# Patient Record
Sex: Male | Born: 1960 | Race: White | Hispanic: No | Smoking: Former smoker
Health system: Southern US, Community
[De-identification: ages and names within clinical notes are randomized; demographics above are authoritative.]

## PROBLEM LIST (undated history)

## (undated) DIAGNOSIS — C20 Malignant neoplasm of rectum: Secondary | ICD-10-CM

## (undated) DIAGNOSIS — I10 Essential (primary) hypertension: Secondary | ICD-10-CM

## (undated) HISTORY — DX: Essential (primary) hypertension: I10

---

## 2012-08-02 ENCOUNTER — Ambulatory Visit: Payer: Self-pay | Admitting: Family Medicine

## 2012-08-18 ENCOUNTER — Ambulatory Visit: Payer: Self-pay | Admitting: Family Medicine

## 2013-04-11 ENCOUNTER — Emergency Department: Payer: Self-pay | Admitting: Internal Medicine

## 2013-04-11 LAB — COMPREHENSIVE METABOLIC PANEL
ALK PHOS: 85 U/L
ALT: 94 U/L — AB (ref 12–78)
AST: 31 U/L (ref 15–37)
Albumin: 4.6 g/dL (ref 3.4–5.0)
Anion Gap: 3 — ABNORMAL LOW (ref 7–16)
BUN: 12 mg/dL (ref 7–18)
Bilirubin,Total: 0.4 mg/dL (ref 0.2–1.0)
CALCIUM: 9.8 mg/dL (ref 8.5–10.1)
Chloride: 102 mmol/L (ref 98–107)
Co2: 31 mmol/L (ref 21–32)
Creatinine: 0.93 mg/dL (ref 0.60–1.30)
EGFR (African American): >60
Glucose: 99 mg/dL (ref 65–99)
Osmolality: 272 (ref 275–301)
Potassium: 3.6 mmol/L (ref 3.5–5.1)
Sodium: 136 mmol/L (ref 136–145)
Total Protein: 7.7 g/dL (ref 6.4–8.2)

## 2013-04-11 LAB — URINALYSIS, COMPLETE
BACTERIA: NONE SEEN
BLOOD: NEGATIVE
Bilirubin,UR: NEGATIVE
Glucose,UR: NEGATIVE mg/dL (ref 0–75)
Ketone: NEGATIVE
LEUKOCYTE ESTERASE: NEGATIVE
NITRITE: NEGATIVE
PH: 5 (ref 4.5–8.0)
Protein: NEGATIVE
Specific Gravity: 1.01 (ref 1.003–1.030)
WBC UR: NONE SEEN /HPF (ref 0–5)

## 2013-04-11 LAB — TROPONIN I: Troponin-I: <0.02 ng/mL

## 2013-04-11 LAB — CBC
HCT: 49.7 % (ref 40.0–52.0)
HGB: 17.4 g/dL (ref 13.0–18.0)
MCH: 32.1 pg (ref 26.0–34.0)
MCHC: 35.1 g/dL (ref 32.0–36.0)
MCV: 91 fL (ref 80–100)
Platelet: 197 10*3/uL (ref 150–440)
RBC: 5.43 10*6/uL (ref 4.40–5.90)
RDW: 13.4 % (ref 11.5–14.5)
WBC: 8.8 10*3/uL (ref 3.8–10.6)

## 2013-06-21 IMAGING — CT CT NECK WITH CONTRAST
2 series · 10 of 14 positions shown, 12 images · IV contrast (agent unspecified)
Comparison: None

REASON FOR EXAM: neck mass
COMMENTS:

PROCEDURE:     KCT - KCT NECK WITH CONTRAST  - August 18, 2012  [DATE]
RESULT:     Indication: Neck mass
TECHNIQUE: Multiple sequential axial images from the apices of the lungs to
the level of the orbits obtained with 100 ml [id number] IV contrast.

[Series 2: neck 3.0 3 · axial · 0.52mm/px · z∈[-432,-166]mm · 8 of 115 slices shown, 10 images]
[im 13/115  soft-tissue]
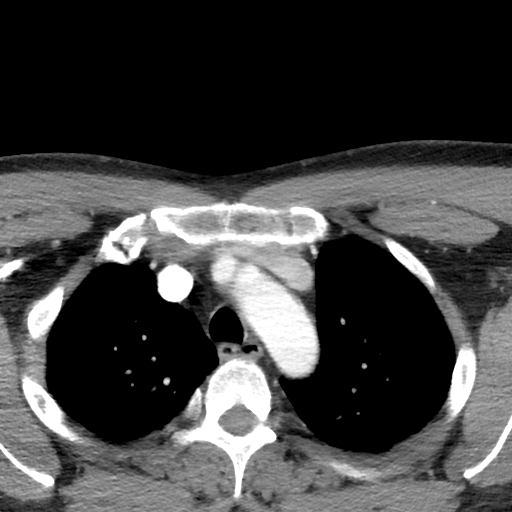
[im 13/115  bone]
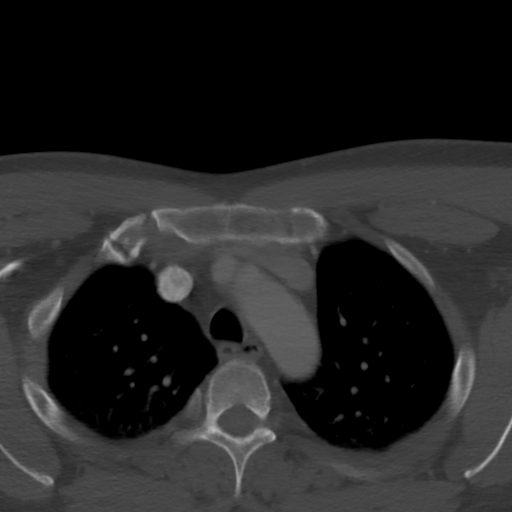
[im 26/115  bone]
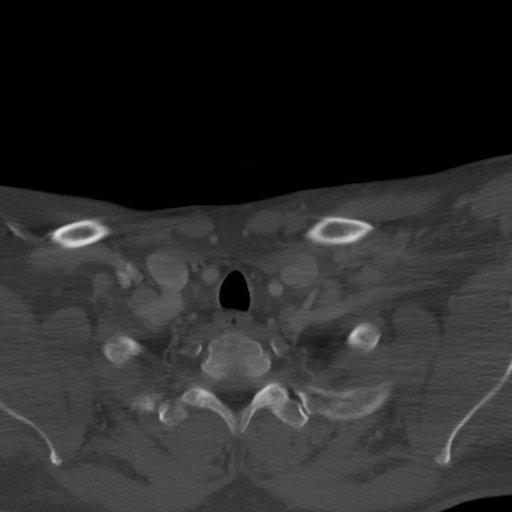
[im 39/115  bone]
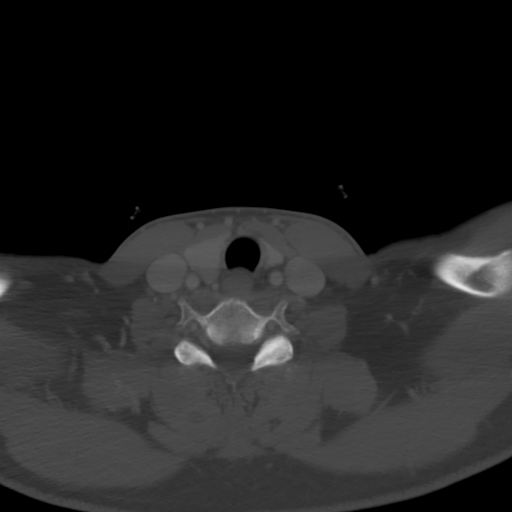
[im 51/115  bone]
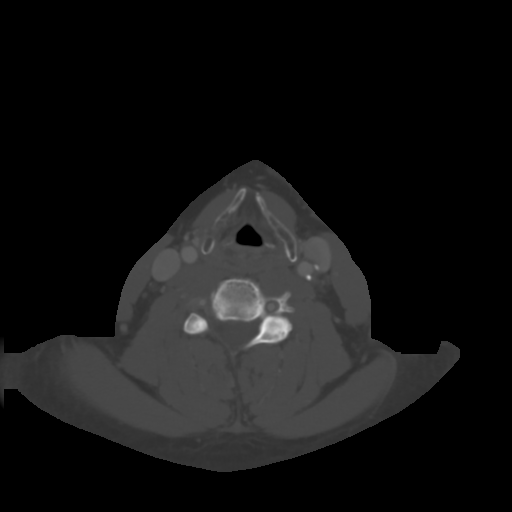
[im 64/115  soft-tissue]
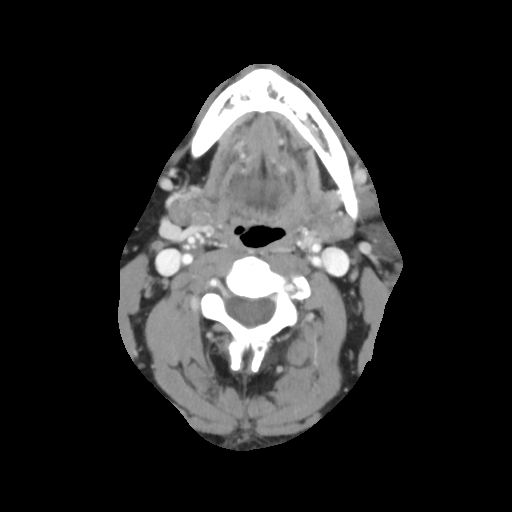
[im 64/115  bone]
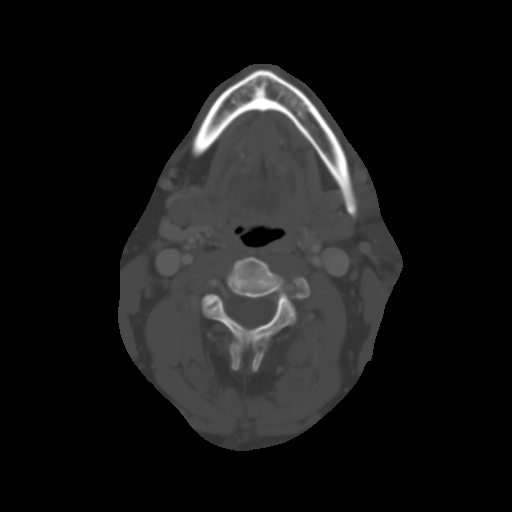
[im 77/115  bone]
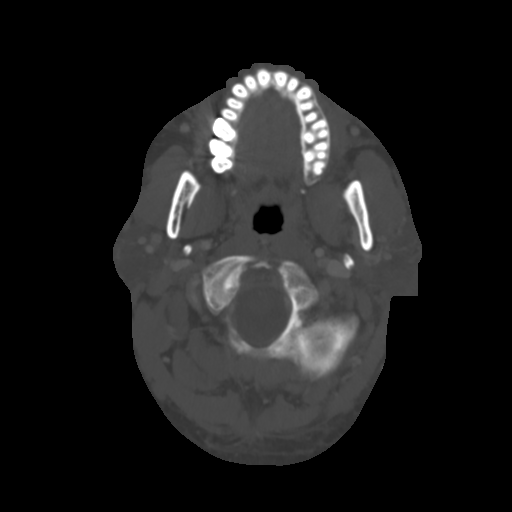
[im 89/115  bone]
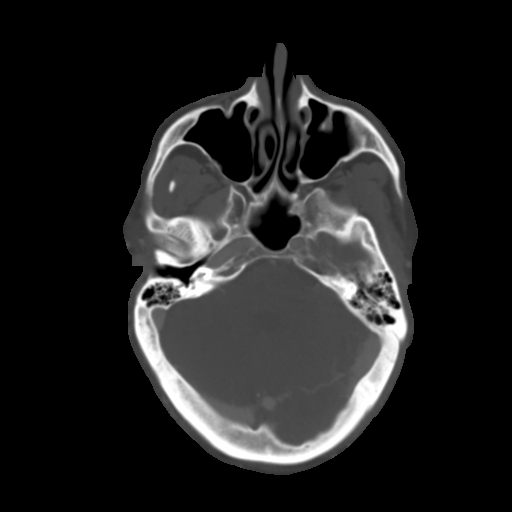
[im 102/115  bone]
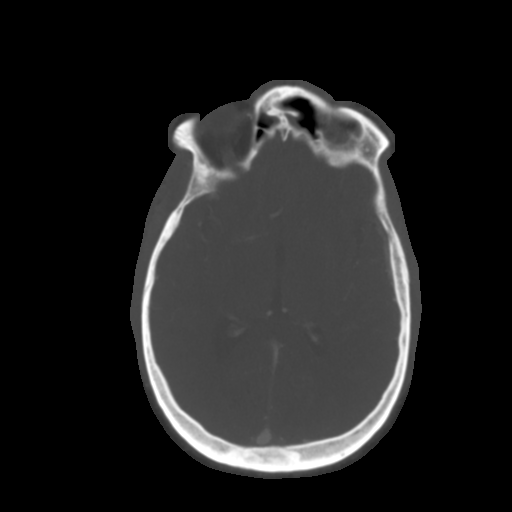

[Series 5: lung · axial · 0.62mm/px · z∈[-430,-390]mm · 2 of 41 slices shown]
[im 14/41  bone]
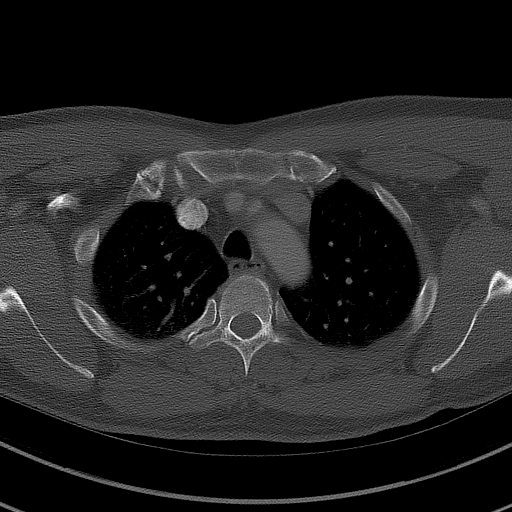
[im 27/41  bone]
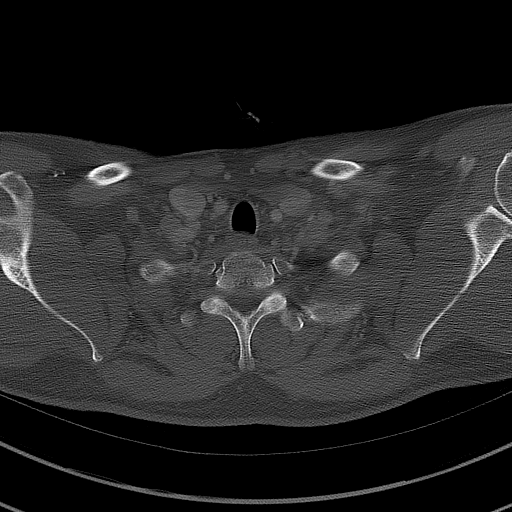

[10 of 14 positions shown; findings below may reference images not displayed]

FINDINGS: There is no lymphadenopathy. There is no nasopharyngeal or
oropharyngeal mass. There is no focal fluid collection. The airway is
patent. The visualized portions of the carotid arteries and internal jugular
veins are patent.

There is a 1.3 x 1.2 cm fat attenuating mass in the right anterior neck at
the site of clinical palpable abnormality. The appearance is most consistent
with a lipoma.

The visualized portions of the brain demonstrate no focal abnormality.

There is minimal mucosal thickening in the ethmoid sinuses and
frontoethmoidal recesses. There is mild mucosal thickening in the right
maxillary sinus. There is a small mucus retention cyst in bilateral
maxillary sinuses.

There is no lytic or blastic osseous lesion.

The lung apices are clear.
IMPRESSION: 1. Small lipoma at the site of clinical palpable abnormality in the right
anterior neck.

[REDACTED]

## 2014-02-12 IMAGING — CT CT HEAD WITHOUT CONTRAST
1 series · 15 of 30 positions shown, 19 images · non-contrast
Comparison: CT NECK W/ CM dated 08/18/2012

CLINICAL DATA: One week history of headaches, history of
hypertension

EXAM:
CT HEAD WITHOUT CONTRAST
TECHNIQUE: Contiguous axial images were obtained from the base of the skull
through the vertex without intravenous contrast.

[Series 2: head wo · axial · 0.46mm/px · z∈[-131,+4]mm · 15 of 34 slices shown, 19 images]
[im 2/34  brain]
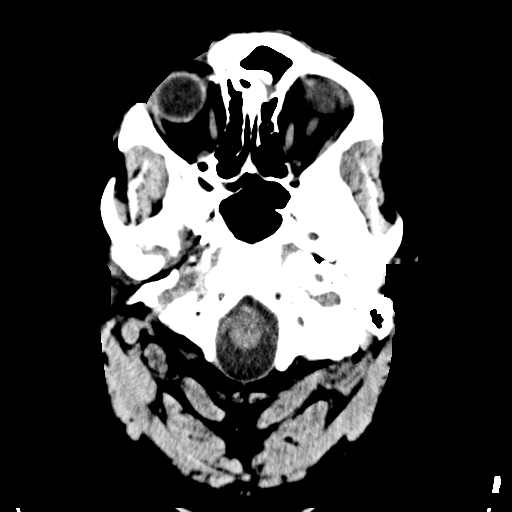
[im 2/34  bone]
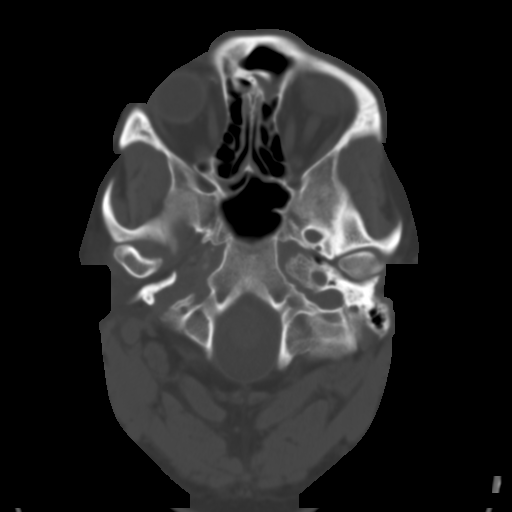
[im 4/34  brain]
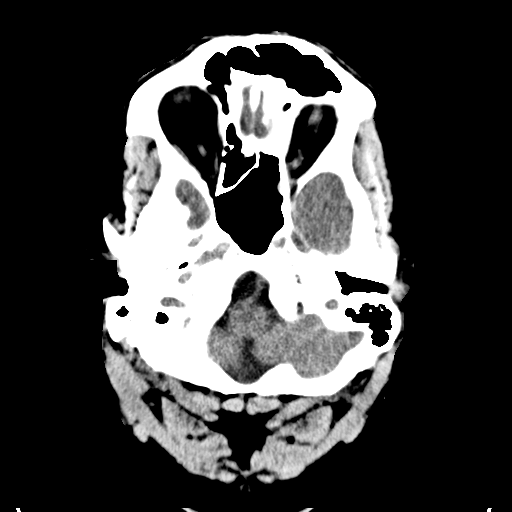
[im 6/34  brain]
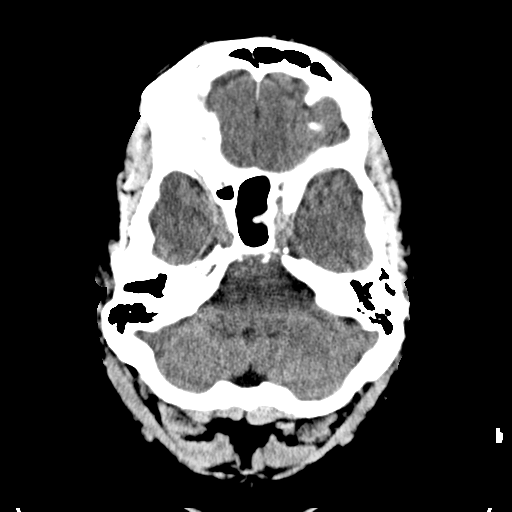
[im 8/34  brain]
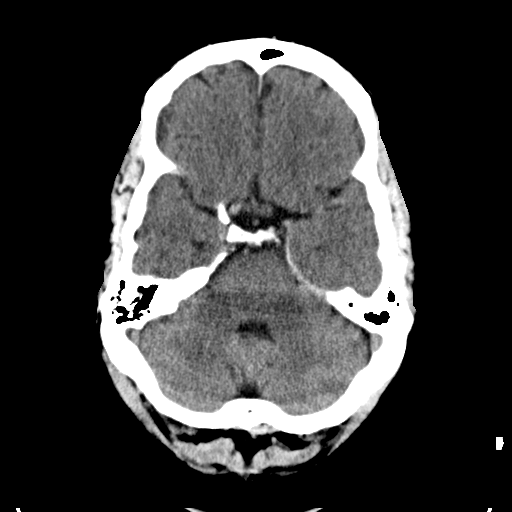
[im 11/34  brain]
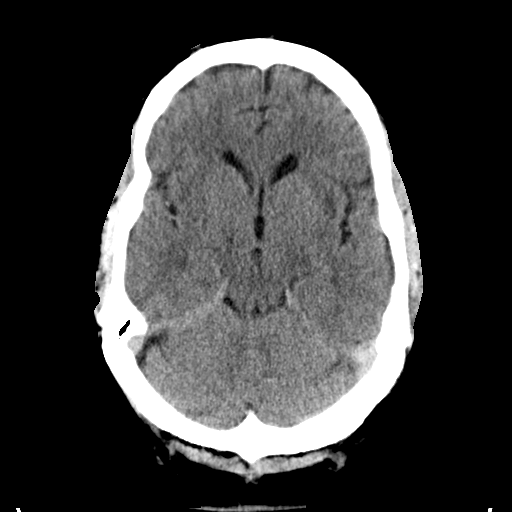
[im 11/34  bone]
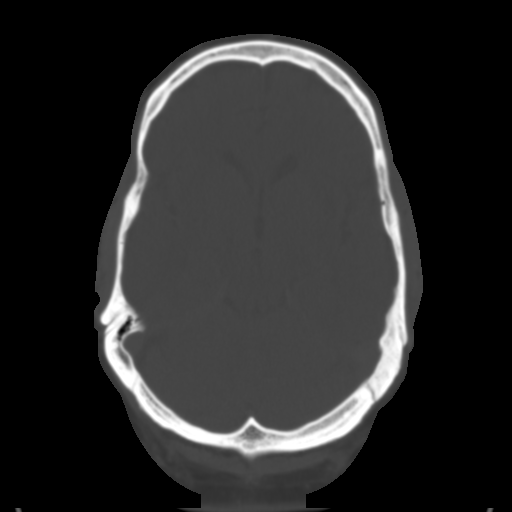
[im 13/34  brain]
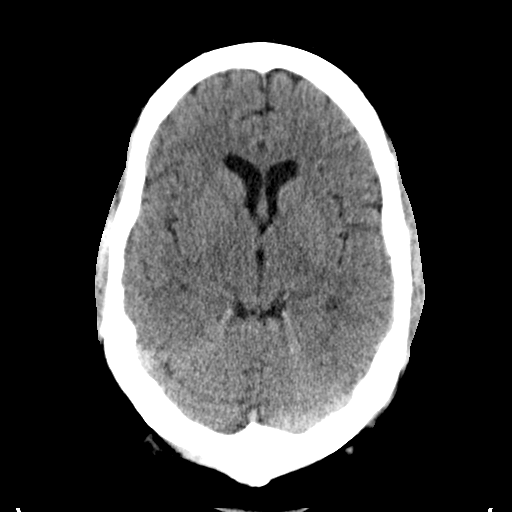
[im 15/34  brain]
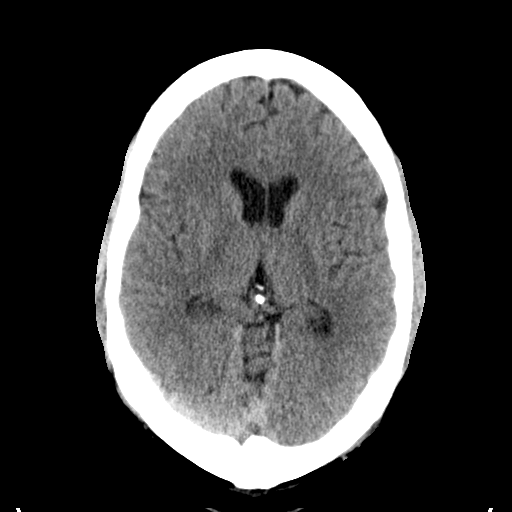
[im 18/34  brain]
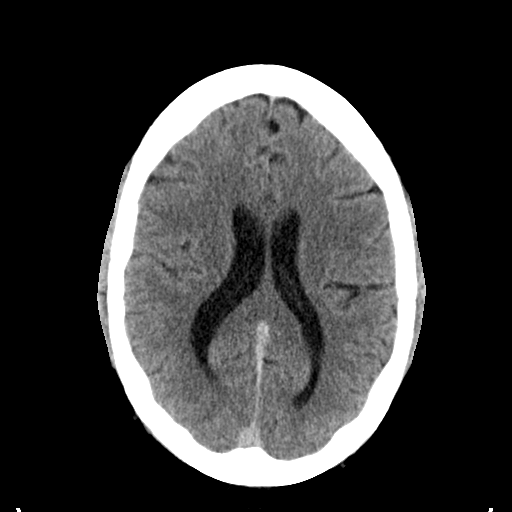
[im 19/34  brain]
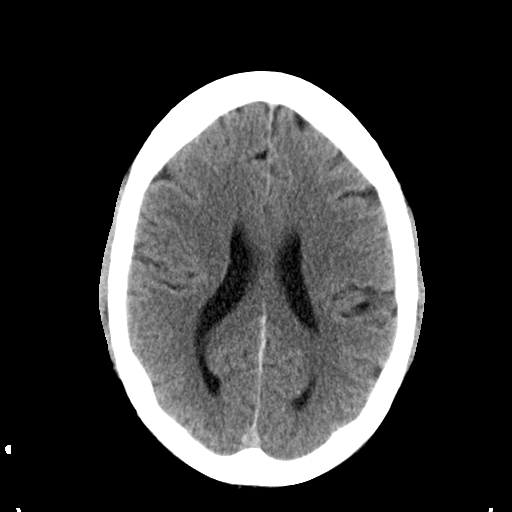
[im 19/34  bone]
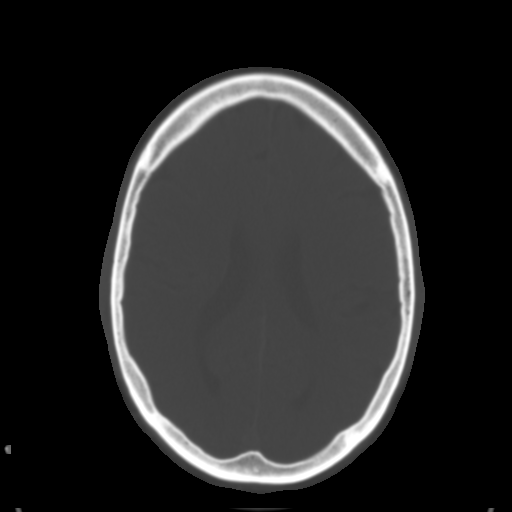
[im 21/34  brain]
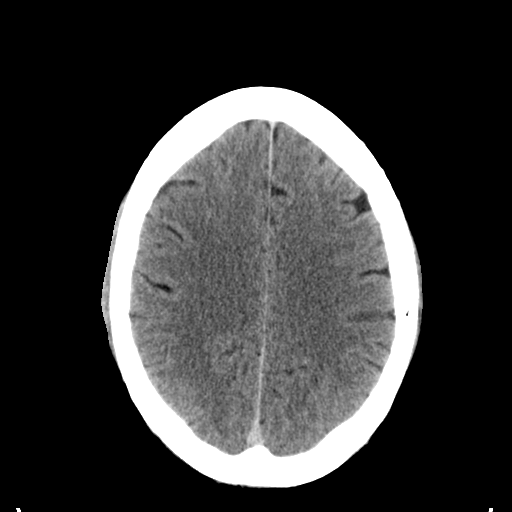
[im 23/34  brain]
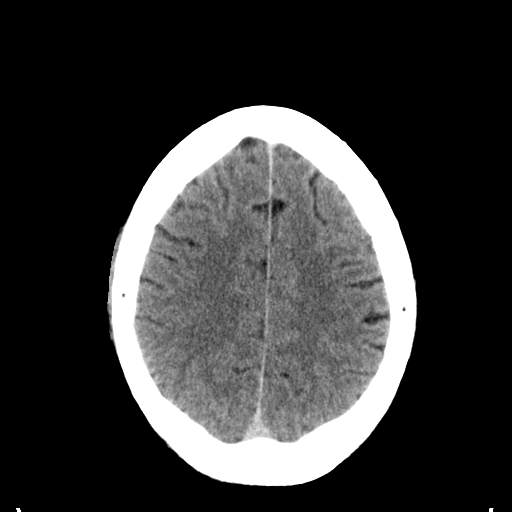
[im 26/34  brain]
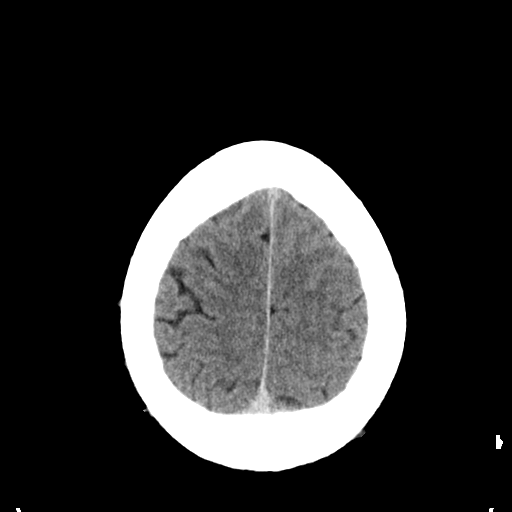
[im 28/34  brain]
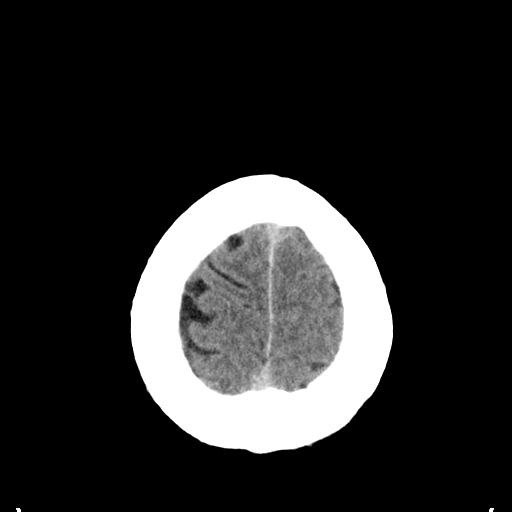
[im 28/34  bone]
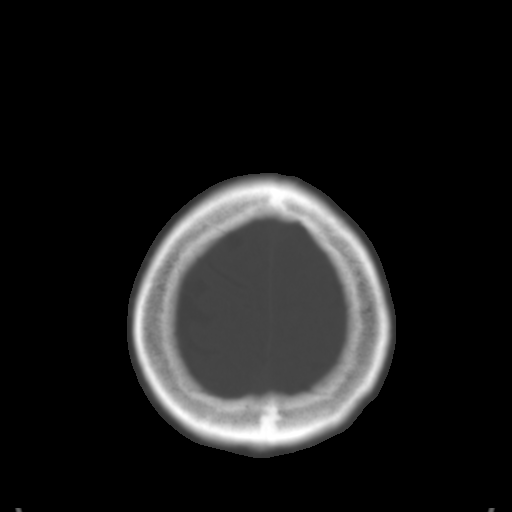
[im 30/34  brain]
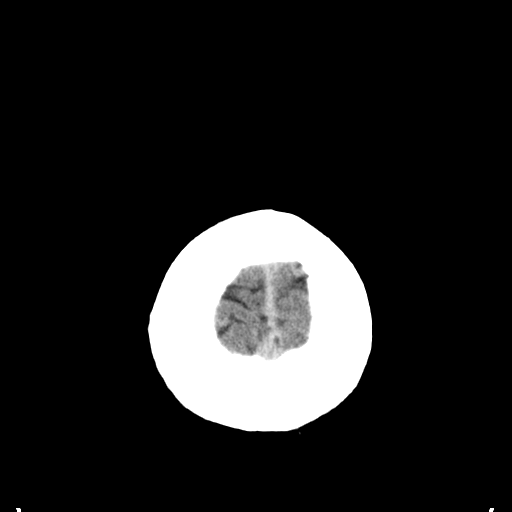
[im 32/34  brain]
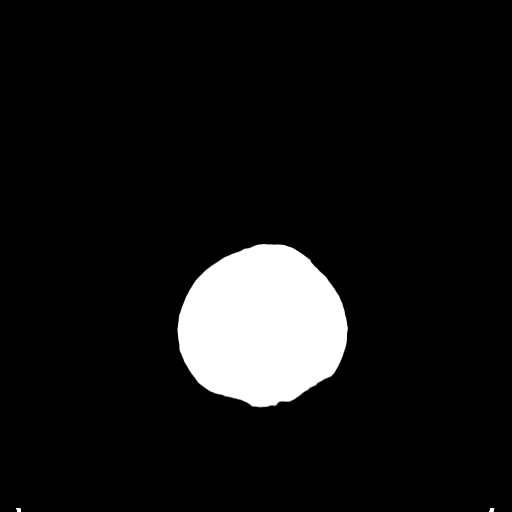

[15 of 30 positions shown; findings below may reference images not displayed]

FINDINGS: The ventricles are normal in size and position. There is no
intracranial hemorrhage nor intracranial mass effect. There is no
evidence of an evolving ischemic infarction. There is subtle
decreased density in the periphery of the basal ganglia on the left
which may reflect the sequelae of previous lacunar infarction. This
is similar to that seen on the uppermost images of a neck CT scan August 18, 2012. There are no abnormal intracranial calcifications. The
cerebellum and brainstem are normal in appearance.

At bone window settings there is mild mucoperiosteal thickening
within several ethmoid sinus cells as well as in the sphenoid sinus.
The maxillary sinuses are not included in the field of view. The
frontal sinuses are clear. No air-fluid levels are demonstrated. The
mastoid air cells are well pneumatized. There is no lytic or blastic
bony lesion of the calvarium nor evidence of an acute fracture.
IMPRESSION: 1. There is no acute intracranial abnormality.
2. There is chronically decreased density in the periphery of the
basal ganglia on the left which may reflect the sequelae of chronic
small vessel ischemic type change.
3. Mucoperiosteal thickening within the ethmoid and sphenoid sinus
cells may reflect sinusitis.

## 2014-06-24 ENCOUNTER — Ambulatory Visit (INDEPENDENT_AMBULATORY_CARE_PROVIDER_SITE_OTHER): Payer: Self-pay | Admitting: General Surgery

## 2014-06-24 ENCOUNTER — Encounter: Payer: Self-pay | Admitting: General Surgery

## 2014-06-24 VITALS — BP 140/78 | HR 78 | Resp 12 | Ht 69.0 in | Wt 203.0 lb

## 2014-06-24 DIAGNOSIS — K6289 Other specified diseases of anus and rectum: Secondary | ICD-10-CM

## 2014-06-24 NOTE — Progress Notes (Signed)
Patient ID: Bobby Rice, male   DOB: [birth year], 54 y.o.   MRN: [id number]  Chief Complaint  Patient presents with  . Other    hemorrhoids    HPI Bobby Rice is a 54 y.o. male here today for a evaluation of hemorrhoids. Patient states he noticed them 16 months ago. He states he has try all kinds of medication (vitamin E, Anusol, Prepaatin H, suposiories) and he reports none of them  Has provide consistet relef. He states they come and go. When they flare up he states there is blood on the paper and in the bowel.  The last episode of bleeding was 6 months ago.   the patient was evaluated through the walk-in Center in   May-June 2014 Re: testicular and ointment pain.  HPI  Past Medical History  Diagnosis Date  . Hypertension     No past surgical history on file.  No family history on file.  Social History History  Substance Use Topics  . Smoking status: Current Every Day Smoker -- 1.00 packs/day for 5 years    Types: Cigars, Cigarettes  . Smokeless tobacco: Not on file  . Alcohol Use: 1.2 oz/week    2 Standard drinks or equivalent per week    Allergies  Allergen Reactions  . Codeine     Current Outpatient Prescriptions  Medication Sig Dispense Refill  . Multiple Vitamin (MULTI-VITAMINS) TABS Take by mouth.    . valsartan (DIOVAN) 40 MG tablet Take 40 mg by mouth daily.     No current facility-administered medications for this visit.    Review of Systems Review of Systems  Constitutional: Negative.   Respiratory: Negative.   Cardiovascular: Negative.   Gastrointestinal: Negative.     Blood pressure 140/78, pulse 78, resp. rate 12, height 5\' 9"  (1.753 m), weight 203 lb (92.08 kg).  Physical Exam Physical Exam  Constitutional: He is oriented to person, place, and time. He appears well-developed and well-nourished.  Eyes: Conjunctivae are normal. No scleral icterus.  Cardiovascular: Normal rate, regular rhythm and normal heart sounds.   Pulmonary/Chest:  Effort normal and breath sounds normal.  Abdominal: Soft. Normal appearance and bowel sounds are normal. There is no hepatomegaly. There is no tenderness.  Genitourinary:        Unable to complete digital rectal exam secondary to discomfort, even after 3 cc 2% Xylocaine jelly instilled into the anal canal.   Neurological: He is alert and oriented to person, place, and time.  Skin: Skin is warm and dry.    Data Reviewed  Testicular ultrasound of May 2014 and CT of June 2014 reviewed. Latter study failed to confirm 5 cm mass in left inguinal area. Hydroceles noted.   Assessment    Rectal pain, intermittent bleeding.    Plan    It was not possible to complete a digital exam today.  Options: 1) observation; 2) colonoscopy (of age for screening and to allow evaluation of anal canal vs 3) EUA. Pros and cons of each reviewed. Patient is reluctant to consider colonoscopy as pain in the anal area is more pronounced with each additional stool (daily).  To consider his options and notify the office of how he would like to proceed.        [name] 06/24/2014, 9:24 PM

## 2014-09-16 ENCOUNTER — Ambulatory Visit
Admission: EM | Admit: 2014-09-16 | Discharge: 2014-09-16 | Disposition: A | Discharge status: Home / Self Care | Payer: Self-pay | Attending: Internal Medicine | Admitting: Internal Medicine

## 2014-09-16 ENCOUNTER — Encounter: Payer: Self-pay | Admitting: *Deleted

## 2014-09-16 DIAGNOSIS — K645 Perianal venous thrombosis: Secondary | ICD-10-CM

## 2014-09-16 MED ORDER — HYDROCORTISONE ACE-PRAMOXINE 1-1 % RE FOAM: 1.0000 | Freq: Two times a day (BID) | RECTAL | Status: DC

## 2014-09-16 MED ORDER — DOCUSATE SODIUM 100 MG PO CAPS: 100.0000 mg | ORAL_CAPSULE | Freq: Two times a day (BID) | ORAL | Status: AC

## 2014-09-16 NOTE — ED Provider Notes (Signed)
CSN: [id number]     Arrival date & time 09/16/14  0174 History   First MD Initiated Contact with Patient 09/16/14 0912     Chief Complaint  Patient presents with  . Hemorrhoids   (Consider location/radiation/quality/duration/timing/severity/associated sxs/prior Treatment) HPI        54 year old male presents for evaluation of a painful hemorrhoid. He has had this for over 2 years, getting gradually worse. He is seen his primary care provider and has been prescribed numerous prescription creams and ointments. He has executed lifestyle changes also with no relief. He has seen a general surgeon who recommended rectal exam under anesthesia and possible hemorrhoidectomy over a month ago, he did want to do this. In the last month it has gotten worse. He thinks he may have a thrombosed hemorrhoid. It is very painful for the last 2 days, he describes it as feeling like a toothache. The hemorrhoid never prolapses. No bleeding.  No systemic sxs.    Past Medical History  Diagnosis Date  . Hypertension    History reviewed. No pertinent past surgical history. No family history on file. History  Substance Use Topics  . Smoking status: Current Every Day Smoker -- 1.00 packs/day for 5 years    Types: Cigars, Cigarettes  . Smokeless tobacco: Not on file  . Alcohol Use: 1.2 oz/week    2 Standard drinks or equivalent per week    Review of Systems  Gastrointestinal: Positive for rectal pain.  All other systems reviewed and are negative.   Allergies  Codeine  Home Medications   Prior to Admission medications   Medication Sig Start Date End Date Taking? Authorizing Provider  Multiple Vitamin (MULTI-VITAMINS) TABS Take by mouth.   Yes Historical Provider, MD  valsartan (DIOVAN) 40 MG tablet Take 40 mg by mouth daily.   Yes Historical Provider, MD  docusate sodium (COLACE) 100 MG capsule Take 1 capsule (100 mg total) by mouth every 12 (twelve) hours. 09/16/14   Liam Graham, PA-C    hydrocortisone-pramoxine (PROCTOFOAM HC) rectal foam Place 1 applicator rectally 2 (two) times daily. 09/16/14   Freeman Caldron Salam [place], PA-C   BP 142/83 mmHg  Pulse 77  Temp(Src) 98.2 F (36.8 C) (Oral)  Resp 16  Ht 5\' 10"  (1.778 m)  Wt 198 lb (89.812 kg)  BMI 28.41 kg/m2  SpO2 100% Physical Exam  Constitutional: He is oriented to person, place, and time. He appears well-developed and well-nourished. No distress.  HENT:  Head: Normocephalic.  Pulmonary/Chest: Effort normal. No respiratory distress.  Abdominal: Soft. There is no tenderness.  Genitourinary: Rectal exam shows internal hemorrhoid and tenderness. Rectal exam shows no external hemorrhoid, no fissure and no mass.     Neurological: He is alert and oriented to person, place, and time. Coordination normal.  Skin: Skin is warm and dry. No rash noted. He is not diaphoretic.  Psychiatric: He has a normal mood and affect. Judgment normal.  Nursing note and vitals reviewed.   ED Course  Procedures (including critical care time) Labs Review Labs Reviewed - No data to display  Imaging Review No results found.   MDM   1. Internal thrombosed hemorrhoids    Internal hemorrhoids, most likely thrombosed. he will be referred to general surgery for follow-up. I will prescribe a stool softener and Proctofoam HC. He is on board with this plan.   Meds ordered this encounter  Medications  . hydrocortisone-pramoxine (PROCTOFOAM HC) rectal foam    Sig: Place 1 applicator rectally 2 (two)  times daily.    Dispense:  10 g    Refill:  0  . docusate sodium (COLACE) 100 MG capsule    Sig: Take 1 capsule (100 mg total) by mouth every 12 (twelve) hours.    Dispense:  60 capsule    Refill:  [facility] Dillie Burandt, PA-C 09/16/14 [id number]

## 2014-09-16 NOTE — Discharge Instructions (Signed)
Disposable Sitz Bath A disposable sitz bath is a plastic basin that fits over the toilet. A bag is hung above the toilet and is connected to a tube that opens into the disposable sitz bath. The bag is filled with warm water that can flow into the basin through the tube.  HOW TO USE A DISPOSABLE SITZ BATH  Close the clamp on the tubing before filling the bag with water. This is to prevent leakage.  Fill the sitz bath basin and the plastic bag with warm water.  Place the filled basin on the toilet with the seat raised. Make sure the overflow opening is facing toward the back of the toilet.  Hang the filled plastic bag overhead on a hook or towel rack close to the toilet. When the bag is unclamped, a steady stream of water will flow from the bag, through the tubing, and into the basin.  Attach the tubing to the opening on the basin.  Sit on the basin positioned on the toilet seat and release the clamp. This will allow warm water to flush the area around your genitals and anus (perineum).  Remain sitting on the basin for approximately 15 to 20 minutes.  Stand up and pat the perineum area dry. If needed, apply clean bandages (dressings) to the affected area.  Tip the basin into the toilet to remove any remaining water and flush the toilet.  Wash the basin with warm water and soap. Let it dry in the sink.  Store the basin and tubing in a clean, dry area.  Wash your hands with soap and water. SEEK MEDICAL CARE IF: You get worse instead of better. Stop the sitz baths if you get worse. MAKE SURE YOU:  Understand these instructions.  Will watch your condition.  Will get help right away if you are not doing well or get worse. Document Released: 08/31/2011 Document Revised: 11/24/2011 Document Reviewed: 08/31/2011 [facility] Patient Information 2015 [place], [place]. This information is not intended to replace advice given to you by your health care provider. Make sure you discuss any questions  you have with your health care provider.   Hemorrhoids Hemorrhoids are swollen veins around the rectum or anus. There are two types of hemorrhoids:   Internal hemorrhoids. These occur in the veins just inside the rectum. They may poke through to the outside and become irritated and painful.  External hemorrhoids. These occur in the veins outside the anus and can be felt as a painful swelling or hard lump near the anus. CAUSES  Pregnancy.   Obesity.   Constipation or diarrhea.   Straining to have a bowel movement.   Sitting for long periods on the toilet.  Heavy lifting or other activity that caused you to strain.  Anal intercourse. SYMPTOMS   Pain.   Anal itching or irritation.   Rectal bleeding.   Fecal leakage.   Anal swelling.   One or more lumps around the anus.  DIAGNOSIS  Your caregiver may be able to diagnose hemorrhoids by visual examination. Other examinations or tests that may be performed include:   Examination of the rectal area with a gloved hand (digital rectal exam).   Examination of anal canal using a small tube (scope).   A blood test if you have lost a significant amount of blood.  A test to look inside the colon (sigmoidoscopy or colonoscopy). TREATMENT Most hemorrhoids can be treated at home. However, if symptoms do not seem to be getting better or if you  have a lot of rectal bleeding, your caregiver may perform a procedure to help make the hemorrhoids get smaller or remove them completely. Possible treatments include:   Placing a rubber band at the base of the hemorrhoid to cut off the circulation (rubber band ligation).   Injecting a chemical to shrink the hemorrhoid (sclerotherapy).   Using a tool to burn the hemorrhoid (infrared light therapy).   Surgically removing the hemorrhoid (hemorrhoidectomy).   Stapling the hemorrhoid to block blood flow to the tissue (hemorrhoid stapling).  HOME CARE INSTRUCTIONS   Eat foods  with fiber, such as whole grains, beans, nuts, fruits, and vegetables. Ask your doctor about taking products with added fiber in them (fibersupplements).  Increase fluid intake. Drink enough water and fluids to keep your urine clear or pale yellow.   Exercise regularly.   Go to the bathroom when you have the urge to have a bowel movement. Do not wait.   Avoid straining to have bowel movements.   Keep the anal area dry and clean. Use wet toilet paper or moist towelettes after a bowel movement.   Medicated creams and suppositories may be used or applied as directed.   Only take over-the-counter or prescription medicines as directed by your caregiver.   Take warm sitz baths for 15-20 minutes, 3-4 times a day to ease pain and discomfort.   Place ice packs on the hemorrhoids if they are tender and swollen. Using ice packs between sitz baths may be helpful.   Put ice in a plastic bag.   Place a towel between your skin and the bag.   Leave the ice on for 15-20 minutes, 3-4 times a day.   Do not use a donut-shaped pillow or sit on the toilet for long periods. This increases blood pooling and pain.  SEEK MEDICAL CARE IF:  You have increasing pain and swelling that is not controlled by treatment or medicine.  You have uncontrolled bleeding.  You have difficulty or you are unable to have a bowel movement.  You have pain or inflammation outside the area of the hemorrhoids. MAKE SURE YOU:  Understand these instructions.  Will watch your condition.  Will get help right away if you are not doing well or get worse. Document Released: 02/27/2000 Document Revised: 02/16/2012 Document Reviewed: 01/04/2012 [facility] Patient Information 2015 [place], [place]. This information is not intended to replace advice given to you by your health care provider. Make sure you discuss any questions you have with your health care provider.  Hemorrhoidectomy Hemorrhoidectomy is surgery to  remove hemorrhoids. Hemorrhoids are veins that have become swollen in the rectum. The rectum is the area from the bottom end of the intestines to the opening where bowel movements leave the body. Hemorrhoids can be uncomfortable. They can cause itching, bleeding and pain if a blood clot forms in them (thrombose). If hemorrhoids are small, surgery may not be needed. But if they cover a larger area, surgery is usually suggested.  LET YOUR CAREGIVER KNOW ABOUT:   Any allergies.  All medications you are taking, including:  Herbs, eyedrops, over-the-counter medications and creams.  Blood thinners (anticoagulants), aspirin or other drugs that could affect blood clotting.  Use of steroids (by mouth or as creams).  Previous problems with anesthetics, including local anesthetics.  Possibility of pregnancy, if this applies.  Any history of blood clots.  Any history of bleeding or other blood problems.  Previous surgery.  Smoking history.  Other health problems. RISKS AND COMPLICATIONS All  surgery carries some risk. However, hemorrhoid surgery usually goes smoothly. Possible complications could include:  Urinary retention.  Bleeding.  Infection.  A painful incision.  A reaction to the anesthesia (this is not common). BEFORE THE PROCEDURE   Stop using aspirin and non-steroidal anti-inflammatory drugs (NSAIDs) for pain relief. This includes prescription drugs and over-the-counter drugs such as ibuprofen and naproxen. Also stop taking vitamin E. If possible, do this two weeks before your surgery.  If you take blood-thinners, ask your healthcare provider when you should stop taking them.  You will probably have blood and urine tests done several days before your surgery.  Do not eat or drink for about 8 hours before the surgery.  Arrive at least an hour before the surgery, or whenever your surgeon recommends. This will give you time to check in and fill out any needed  paperwork.  Hemorrhoidectomy is often an outpatient procedure. This means you will be able to go home the same day. Sometimes, though, people stay overnight in the hospital after the procedure. Ask your surgeon what to expect. Either way, make arrangements in advance for someone to drive you home. PROCEDURE   The preparation:  You will change into a hospital gown.  You will be given an IV. A needle will be inserted in your arm. Medication can flow directly into your body through this needle.  You might be given an enema to clear your rectum.  Once in the operating room, you will probably lie on your side or be repositioned later to lying on your stomach.  You will be given anesthesia (medication) so you will not feel anything during the surgery. The surgery often is done with local anesthesia (the area near the hemorrhoids will be numb and you will be drowsy but awake). Sometimes, general anesthesia is used (you will be asleep during the procedure).  The procedure:  There are a few different procedures for hemorrhoids. Be sure to ask you surgeon about the procedure, the risks and benefits.  Be sure to ask about what you need to do to take care of the wound, if there is one. AFTER THE PROCEDURE  You will stay in a recovery area until the anesthesia has worn off. Your blood pressure and pulse will be checked every so often.  You may feel a lot of pain in the area of the rectum.  Take all pain medication prescribed by your surgeon. Ask before taking any over-the-counter pain medicines.  Sometimes sitting in a warm bath can help relieve your pain.  To make sure you have bowel movements without straining:  You will probably need to take stool softeners (usually a pill) for a few days.  You should drink 8 to 10 glasses of water each day.  Your activity will be restricted for awhile. Ask your caregiver for a list of what you should and should not do while you recover. Document  Released: 12/27/2008 Document Revised: 05/24/2011 Document Reviewed: 12/27/2008 [facility] Patient Information 2015 [place], [place]. This information is not intended to replace advice given to you by your health care provider. Make sure you discuss any questions you have with your health care provider.

## 2014-09-16 NOTE — ED Notes (Signed)
Pt states "Painful Hemorid, seen Dr Tollie Pizza, tried to have a rectal exam but didn't go well, mornings are worse, comes out in and goes back in several times during the day, gradually getting worse, feels like a toothache, difficulty sitting and standing"

## 2014-09-17 ENCOUNTER — Encounter: Payer: Self-pay | Admitting: Surgery

## 2014-09-23 ENCOUNTER — Encounter: Payer: Self-pay | Admitting: Surgery

## 2014-10-07 ENCOUNTER — Telehealth: Payer: Self-pay | Admitting: Surgery

## 2014-10-07 ENCOUNTER — Encounter: Payer: Self-pay | Admitting: Surgery

## 2014-10-07 ENCOUNTER — Ambulatory Visit (INDEPENDENT_AMBULATORY_CARE_PROVIDER_SITE_OTHER): Payer: PRIVATE HEALTH INSURANCE | Admitting: Surgery

## 2014-10-07 VITALS — BP 118/79 | HR 76 | Temp 98.3°F | Ht 69.5 in | Wt 196.0 lb

## 2014-10-07 DIAGNOSIS — K602 Anal fissure, unspecified: Secondary | ICD-10-CM | POA: Diagnosis not present

## 2014-10-07 NOTE — Telephone Encounter (Signed)
Patient called after he visit today and they talked about Surgery, He was asking if there were other tests or something else that can be tried instead of surgery.

## 2014-10-07 NOTE — Telephone Encounter (Signed)
Called patient back at this time. No answer. Left Voicemail for patient to return phone call.  He was seen in office for hemorrhoids.

## 2014-10-07 NOTE — Telephone Encounter (Signed)
Spoke with patient at this time. He states that Dr. Pat Patrick spoke with him today at appointment regarding an anal fissure vs. Hemorrhoids.  I talked with patient about the benefits of Stool softeners, keeping the area as clean and dry as possible, and Sitz baths.  I explained that I would speak with Dr. Pat Patrick to see if anything further can be done prior to surgery. Pt also wants to know if it would benefit him to have a Colonoscopy as he has never had this done before instead of having an exam under anesthesia.  Will call patient back after speaking to Dr. Pat Patrick.

## 2014-10-07 NOTE — Progress Notes (Signed)
Surgical Consultation  10/07/2014  Bobby Rice is an 54 y.o. male. With a complaint of rectal pain and bleeding.  Chief Complaint  Patient presents with  . Other    Hemorrhoids x 6 months     HPI: He has had rectal pain and bleeding for several months. His first symptom was approximately 6 months ago when he had sudden rectal pain accompanied by large amount of bright red blood per rectum. The bleeding problem has slowed over the last several months but he continues to have significant pain and bleeding with each bowel movement. He was seen by another general surgeon who recommended colonoscopy versus rectal exam under anesthesia. However, the patient did not have any insurance and time and declined to consider surgical intervention at that time. He was treated with topical and local treatments without much success.  He was seen in the outpatient acute care unit recently and told he likely had a thrombosed hemorrhoid. He was referred to our office for further evaluation and possible intervention. Her graft he has no other history of rectal problems. He's had no previous rectal surgery. He has recently completed a course of steroid medication.  Past Medical History  Diagnosis Date  . Hypertension     History reviewed. No pertinent past surgical history.  History reviewed. No pertinent family history.  Social History:  reports that he has been smoking Cigars and Cigarettes.  He has a 5 pack-year smoking history. He does not have any smokeless tobacco history on file. He reports that he drinks about 1.2 oz of alcohol per week. He reports that he does not use illicit drugs.  Allergies:  Allergies  Allergen Reactions  . Codeine     Medications reviewed.     Review of Systems  Constitutional: Negative for fever and chills.  HENT: Negative.   Eyes: Negative.   Respiratory: Negative.   Cardiovascular: Negative.   Gastrointestinal: Positive for blood in stool. Negative for  heartburn and nausea.  Genitourinary: Negative.   Musculoskeletal: Negative.   Skin: Negative.   Neurological: Negative.   Psychiatric/Behavioral: Negative.        BP 118/79 mmHg  Pulse 76  Temp(Src) 98.3 F (36.8 C) (Oral)  Ht 5' 9.5" (1.765 m)  Wt 196 lb (88.905 kg)  BMI 28.54 kg/m2  Physical Exam  Constitutional: He is oriented to person, place, and time and well-developed, well-nourished, and in no distress.  HENT:  Head: Normocephalic and atraumatic.  Eyes: EOM are normal. Pupils are equal, round, and reactive to light.  Neck: Normal range of motion. Neck supple.  Cardiovascular: Regular rhythm and normal heart sounds.   Pulmonary/Chest: Effort normal and breath sounds normal.  Abdominal: Soft. Bowel sounds are normal.  Genitourinary:  Small external hemorrhoid and what appears to be a fissure on the right lateral wall  Musculoskeletal: Normal range of motion.  Neurological: He is alert and oriented to person, place, and time.  Skin: Skin is warm and dry.  Psychiatric: Affect and judgment normal.      No results found for this or any previous visit (from the past 48 hour(s)). No results found.  Assessment/Plan: 1. Anal fissure We discussed the options available to him. He is very anxious and upset that nothing has been done so far. He would like to consider further intervention as quickly as possible. I told him I agreed with his previous surgeon that exam under anesthesia is the next most appropriate step. He is in agreement.  He will  need a colonoscopy following this procedure on elective basis for screening. He is aware of this suggestion.   Dia Crawford III dermatitis

## 2014-10-07 NOTE — Patient Instructions (Signed)
Angie will be contacting you to schedule your surgery. Call us if you have a question.

## 2014-10-08 ENCOUNTER — Inpatient Hospital Stay: Admission: RE | Admit: 2014-10-08 | Payer: Self-pay | Source: Ambulatory Visit

## 2014-10-08 ENCOUNTER — Telehealth: Payer: Self-pay | Admitting: Surgery

## 2014-10-08 NOTE — Telephone Encounter (Signed)
Spoke with patient at this time. Explained procedure that is planned. Answered all questions to patient's satisfaction.   He wants to hold off until August 10th as he states that he has a conflict with tomorrow's schedule.   Angie notified and will cancel OR case scheduled 7/27. Dr. Pat Patrick notified.  Please place Rectal Exam Under Anesthesia on schedule for August 10th for Dr. Pat Patrick.

## 2014-10-08 NOTE — Telephone Encounter (Signed)
I have called Pt to advise of pre op date/time and sx date. No answer. I have left a Voicemail. Sx: 10/09/14 with Dr Samuella Bruin.  Pre op: Phone: 10/08/14 between 1-5pm.

## 2014-10-08 NOTE — Telephone Encounter (Signed)
Spoke with patient at this time after reading notes from yesterday's visit. Explained that after reading notes, Dr. Pat Patrick was recommending to do an exam under anesthesia to get the patient an actual diagnosis so that the patient can move forward with treatment specific to an Anal Fissure or Hemorrhoids to get the patient the best tailored treatment possible.  States that this sounds great because he is frustrated that a diagnosis has never been made.  Pt understands this now and is in agreement.

## 2014-10-08 NOTE — Telephone Encounter (Signed)
Pt has been advised of surgery/pre op date and time. He would like to speak with a nurse to discuss the procedure that is scheduled: EAU. Pt states that he does not want to have a procedure that is going to make the pain worse. I have advised patient that the EUA is done due to the uncomfortable pain that it would cause if this was done in the office. Treatment would be considered after the EUA.  Please call patient to discuss any other questions the patient may have.

## 2014-10-09 ENCOUNTER — Ambulatory Visit: Admit: 2014-10-09 | Payer: Self-pay | Admitting: Surgery

## 2014-10-09 ENCOUNTER — Ambulatory Visit: Payer: Self-pay | Admitting: Surgery

## 2014-10-09 SURGERY — EXAM UNDER ANESTHESIA, RECTUM
Anesthesia: Choice

## 2014-10-09 NOTE — Telephone Encounter (Signed)
Patient has called and stated that he would like to cancel procedure due to his insurance not covering the services/Dr. Pat Patrick not in-network.   Procedure for 10/23/14 with Dr Rexene Edison for EUA has been canceled.

## 2014-10-10 ENCOUNTER — Ambulatory Visit: Payer: Self-pay | Admitting: Surgery

## 2014-10-15 ENCOUNTER — Other Ambulatory Visit: Payer: Self-pay

## 2014-10-23 ENCOUNTER — Ambulatory Visit: Admit: 2014-10-23 | Payer: Self-pay | Admitting: Surgery

## 2014-10-23 SURGERY — EXAM UNDER ANESTHESIA, RECTUM
Anesthesia: Choice

## 2015-01-07 ENCOUNTER — Encounter: Payer: Self-pay | Admitting: Emergency Medicine

## 2015-01-07 ENCOUNTER — Emergency Department
Admission: EM | Admit: 2015-01-07 | Discharge: 2015-01-07 | Disposition: A | Discharge status: Home / Self Care | Payer: 59 | Attending: Emergency Medicine | Admitting: Emergency Medicine

## 2015-01-07 DIAGNOSIS — Z87891 Personal history of nicotine dependence: Secondary | ICD-10-CM | POA: Insufficient documentation

## 2015-01-07 DIAGNOSIS — I10 Essential (primary) hypertension: Secondary | ICD-10-CM | POA: Diagnosis present

## 2015-01-07 DIAGNOSIS — Z79899 Other long term (current) drug therapy: Secondary | ICD-10-CM | POA: Insufficient documentation

## 2015-01-07 HISTORY — DX: Malignant neoplasm of rectum: C20

## 2015-01-07 NOTE — ED Provider Notes (Signed)
[facility] Emergency Department Provider Note  ____________________________________________  Time seen: Seen upon arrival to the emergency department  I have reviewed the triage vital signs and the nursing notes.   HISTORY  Chief Complaint Hypertension    HPI Bobby Rice is a 54 y.o. male with a history of hypertension and rectal cancer who is presenting today with elevated blood pressure. He says that his blood pressure home was in the 200s over 100s. He denies any chest pain or shortness of breath. He denies any nausea vomiting or diaphoresis. He called ambulance because he was concerned for his high blood pressure. He is denying any headache at this time. His that he normally takes 10 mg a valsartan in the morning but took an additional 10 mg tonight at 6 PM tonight when he found out that his pressure was elevated. He has no appointment for follow-up with his cancer specialist tomorrow at [facility].  Patient also says that he has had blood coating his stool earlier today but then had an additional bowel movement without any bleeding. He says that he has had this intermittently with his cancer.   Past Medical History  Diagnosis Date  . Hypertension   . Rectal cancer [facility])     Patient Active Problem List   Diagnosis Date Noted  . Rectal pain 06/24/2014    History reviewed. No pertinent past surgical history.  Current Outpatient Rx  Name  Route  Sig  Dispense  Refill  . docusate sodium (COLACE) 100 MG capsule   Oral   Take 1 capsule (100 mg total) by mouth every 12 (twelve) hours.   60 capsule   0   . Multiple Vitamin (MULTI-VITAMINS) TABS   Oral   Take by mouth.         . valsartan (DIOVAN) 40 MG tablet   Oral   Take 40 mg by mouth daily.           Allergies Codeine  No family history on file.  Social History Social History  Substance Use Topics  . Smoking status: Former Smoker -- 1.00 packs/day for 5 years    Types: Cigars,  Cigarettes  . Smokeless tobacco: None  . Alcohol Use: No    Review of Systems Constitutional: No fever/chills Eyes: No visual changes. ENT: No sore throat. Cardiovascular: Denies chest pain. Respiratory: Denies shortness of breath. Gastrointestinal: No abdominal pain.  No nausea, no vomiting.  No diarrhea.  No constipation. Genitourinary: Negative for dysuria. Musculoskeletal: Negative for back pain. Skin: Negative for rash. Neurological: Negative for headaches, focal weakness or numbness.  10-point ROS otherwise negative.  ____________________________________________   PHYSICAL EXAM:  VITAL SIGNS: ED Triage Vitals  Enc Vitals Group     BP 01/07/15 2102 152/96 mmHg     Pulse Rate 01/07/15 2102 79     Resp 01/07/15 2102 18     Temp --      Temp Source 01/07/15 2102 Oral     SpO2 01/07/15 2102 100 %     Weight 01/07/15 2102 170 lb (77.111 kg)     Height 01/07/15 2102 5\' 9"  (1.753 m)     Head Cir --      Peak Flow --      Pain Score 01/07/15 2107 0     Pain Loc --      Pain Edu? --      Excl. in [place]? --     Constitutional: Alert and oriented. Well appearing and in  no acute distress. Eyes: Conjunctivae are normal. PERRL. EOMI. Head: Atraumatic. Nose: No congestion/rhinnorhea. Mouth/Throat: Mucous membranes are moist.  Oropharynx non-erythematous. Neck: No stridor.   Cardiovascular: Normal rate, regular rhythm. Grossly normal heart sounds.  Good peripheral circulation. Respiratory: Normal respiratory effort.  No retractions. Lungs CTAB. Gastrointestinal: Soft and nontender. No distention. No abdominal bruits. No CVA tenderness. Not engorged hemorrhoid at 8:00. Musculoskeletal: No lower extremity tenderness nor edema.  No joint effusions. Neurologic:  Normal speech and language. No gross focal neurologic deficits are appreciated. No gait instability. Skin:  Skin is warm, dry and intact. No rash noted. Psychiatric: Mood and affect are normal. Speech and behavior are  normal.  ____________________________________________   LABS (all labs ordered are listed, but only abnormal results are displayed)  Labs Reviewed - No data to display ____________________________________________  EKG   ____________________________________________  RADIOLOGY   ____________________________________________   PROCEDURES   ____________________________________________   INITIAL IMPRESSION / ASSESSMENT AND PLAN / ED COURSE  Pertinent labs & imaging results that were available during my care of the patient were reviewed by me and considered in my medical decision making (see chart for details).  ----------------------------------------- 9:38 PM on 01/07/2015 -----------------------------------------  Patient is without any acute distress. His blood pressure has been steadily decreasing and the last one I was in the room was in the 140s over 80s. He will continue to take his 10 mg valsartan the morning and increased to 20 as needed. He will be rechecked tomorrow by his oncologist, Dr. Truman Hayward at [facility]. The patient understands the plan for discharge and is willing to comply. Also understands return precautions including any worsening or concerning symptoms. ____________________________________________   FINAL CLINICAL IMPRESSION(S) / ED DIAGNOSES  Acute on chronic hypertension.    [name], MD 01/07/15 [phone]

## 2015-01-07 NOTE — ED Notes (Signed)
Pt presents to ED via [facility] from home c/o elevated blood pressure, EMS states pt took bop at home 208/110, EMS bp was 174/104. Pt was placed on new bp medicine, has been taking Valsartan x 1 week. Pt reports took 10 mg today between 5 pm-6 pm. Pt denies nausea, dizziness,weakness, shortness of breath, chest pain or abdominal pain. Pt also reports was constipated x 5 days, took stool softener and noticed bright red blood in stool this morning. Pt reports had another BM this evening, denies blood in stool. Pt dx with rectal cancer in August 2016, is currently having chemotherapy. Pt has hx of GI complications. Pt A&O x 4, no increased work in breathing, skin warm and dry. Call bell within reach. MD at bedside.

## 2015-01-07 NOTE — Discharge Instructions (Signed)
Hypertension Hypertension, commonly called high blood pressure, is when the force of blood pumping through your arteries is too strong. Your arteries are the blood vessels that carry blood from your heart throughout your body. A blood pressure reading consists of a higher number over a lower number, such as 110/72. The higher number (systolic) is the pressure inside your arteries when your heart pumps. The lower number (diastolic) is the pressure inside your arteries when your heart relaxes. Ideally you want your blood pressure below 120/80. Hypertension forces your heart to work harder to pump blood. Your arteries may become narrow or stiff. Having untreated or uncontrolled hypertension can cause heart attack, stroke, kidney disease, and other problems. RISK FACTORS Some risk factors for high blood pressure are controllable. Others are not.  Risk factors you cannot control include:   Race. You may be at higher risk if you are African American.  Age. Risk increases with age.  Gender. Men are at higher risk than women before age 45 years. After age 65, women are at higher risk than men. Risk factors you can control include:  Not getting enough exercise or physical activity.  Being overweight.  Getting too much fat, sugar, calories, or salt in your diet.  Drinking too much alcohol. SIGNS AND SYMPTOMS Hypertension does not usually cause signs or symptoms. Extremely high blood pressure (hypertensive crisis) may cause headache, anxiety, shortness of breath, and nosebleed. DIAGNOSIS To check if you have hypertension, your health care provider will measure your blood pressure while you are seated, with your arm held at the level of your heart. It should be measured at least twice using the same arm. Certain conditions can cause a difference in blood pressure between your right and left arms. A blood pressure reading that is higher than normal on one occasion does not mean that you need treatment. If  it is not clear whether you have high blood pressure, you may be asked to return on a different day to have your blood pressure checked again. Or, you may be asked to monitor your blood pressure at home for 1 or more weeks. TREATMENT Treating high blood pressure includes making lifestyle changes and possibly taking medicine. Living a healthy lifestyle can help lower high blood pressure. You may need to change some of your habits. Lifestyle changes may include:  Following the DASH diet. This diet is high in fruits, vegetables, and whole grains. It is low in salt, red meat, and added sugars.  Keep your sodium intake below 2,300 mg per day.  Getting at least 30-45 minutes of aerobic exercise at least 4 times per week.  Losing weight if necessary.  Not smoking.  Limiting alcoholic beverages.  Learning ways to reduce stress. Your health care provider may prescribe medicine if lifestyle changes are not enough to get your blood pressure under control, and if one of the following is true:  You are 18-59 years of age and your systolic blood pressure is above 140.  You are 60 years of age or older, and your systolic blood pressure is above 150.  Your diastolic blood pressure is above 90.  You have diabetes, and your systolic blood pressure is over 140 or your diastolic blood pressure is over 90.  You have kidney disease and your blood pressure is above 140/90.  You have heart disease and your blood pressure is above 140/90. Your personal target blood pressure may vary depending on your medical conditions, your age, and other factors. HOME CARE INSTRUCTIONS    Have your blood pressure rechecked as directed by your health care provider.   Take medicines only as directed by your health care provider. Follow the directions carefully. Blood pressure medicines must be taken as prescribed. The medicine does not work as well when you skip doses. Skipping doses also puts you at risk for  problems.  Do not smoke.   Monitor your blood pressure at home as directed by your health care provider. SEEK MEDICAL CARE IF:   You think you are having a reaction to medicines taken.  You have recurrent headaches or feel dizzy.  You have swelling in your ankles.  You have trouble with your vision. SEEK IMMEDIATE MEDICAL CARE IF:  You develop a severe headache or confusion.  You have unusual weakness, numbness, or feel faint.  You have severe chest or abdominal pain.  You vomit repeatedly.  You have trouble breathing. MAKE SURE YOU:   Understand these instructions.  Will watch your condition.  Will get help right away if you are not doing well or get worse.   This information is not intended to replace advice given to you by your health care provider. Make sure you discuss any questions you have with your health care provider.   Document Released: 03/01/2005 Document Revised: 07/16/2014 Document Reviewed: 12/22/2012 Elsevier Interactive Patient Education 2016 [employer]  

## 2015-01-09 ENCOUNTER — Emergency Department
Admission: EM | Admit: 2015-01-09 | Discharge: 2015-01-09 | Discharge status: Against Medical Advice | Payer: PRIVATE HEALTH INSURANCE | Attending: Emergency Medicine | Admitting: Emergency Medicine

## 2015-01-09 DIAGNOSIS — I10 Essential (primary) hypertension: Secondary | ICD-10-CM | POA: Diagnosis not present

## 2015-01-09 NOTE — ED Notes (Signed)
Pt reports high blood pressure.  Pt has rectal cancer.  Pt was seen in er 2 days ago for htn.  Pt saw oncologist yesterday.  Pt continues to have high blood pressure.

## 2015-01-09 NOTE — ED Notes (Signed)
After taking blood pressure, pt decided not to be seen.  Pt will follow up with his doctor.

## 2015-02-27 ENCOUNTER — Other Ambulatory Visit: Payer: Self-pay | Admitting: Internal Medicine

## 2015-02-27 DIAGNOSIS — N433 Hydrocele, unspecified: Secondary | ICD-10-CM

## 2015-03-04 ENCOUNTER — Ambulatory Visit: Payer: No Typology Code available for payment source

## 2015-07-14 DEATH — deceased
